# Patient Record
Sex: Male | Born: 1955 | Race: White | Hispanic: No | Marital: Single | Smoking: Never smoker
Health system: Southern US, Community
[De-identification: ages and names within clinical notes are randomized; demographics above are authoritative.]

## PROBLEM LIST (undated history)

## (undated) DIAGNOSIS — I2510 Atherosclerotic heart disease of native coronary artery without angina pectoris: Secondary | ICD-10-CM

## (undated) DIAGNOSIS — E669 Obesity, unspecified: Secondary | ICD-10-CM

## (undated) DIAGNOSIS — E7800 Pure hypercholesterolemia, unspecified: Secondary | ICD-10-CM

## (undated) DIAGNOSIS — I219 Acute myocardial infarction, unspecified: Secondary | ICD-10-CM

## (undated) DIAGNOSIS — E119 Type 2 diabetes mellitus without complications: Secondary | ICD-10-CM

## (undated) DIAGNOSIS — I10 Essential (primary) hypertension: Secondary | ICD-10-CM

## (undated) HISTORY — PX: CORONARY ANGIOPLASTY: SHX604

## (undated) HISTORY — PX: CARDIAC CATHETERIZATION: SHX172

---
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
## 2011-03-05 ENCOUNTER — Inpatient Hospital Stay: Payer: Self-pay | Admitting: Cardiology

## 2011-03-24 ENCOUNTER — Encounter: Payer: Self-pay | Admitting: Cardiology

## 2011-04-02 ENCOUNTER — Encounter: Payer: Self-pay | Admitting: Cardiology

## 2011-05-02 ENCOUNTER — Encounter: Payer: Self-pay | Admitting: Cardiology

## 2012-12-06 ENCOUNTER — Ambulatory Visit: Payer: Self-pay | Admitting: Gastroenterology

## 2018-05-18 ENCOUNTER — Other Ambulatory Visit: Payer: Self-pay | Admitting: Gastroenterology

## 2018-05-18 DIAGNOSIS — D696 Thrombocytopenia, unspecified: Secondary | ICD-10-CM

## 2018-05-18 DIAGNOSIS — K760 Fatty (change of) liver, not elsewhere classified: Secondary | ICD-10-CM

## 2018-05-18 DIAGNOSIS — R17 Unspecified jaundice: Secondary | ICD-10-CM

## 2018-05-26 ENCOUNTER — Ambulatory Visit
Admission: RE | Admit: 2018-05-26 | Discharge: 2018-05-26 | Disposition: A | Discharge status: Home / Self Care | Payer: BC Managed Care – PPO | Source: Ambulatory Visit | Attending: Gastroenterology | Admitting: Gastroenterology

## 2018-05-26 DIAGNOSIS — D696 Thrombocytopenia, unspecified: Secondary | ICD-10-CM

## 2018-05-26 DIAGNOSIS — R161 Splenomegaly, not elsewhere classified: Secondary | ICD-10-CM | POA: Insufficient documentation

## 2018-05-26 DIAGNOSIS — R932 Abnormal findings on diagnostic imaging of liver and biliary tract: Secondary | ICD-10-CM | POA: Diagnosis not present

## 2018-05-26 DIAGNOSIS — R17 Unspecified jaundice: Secondary | ICD-10-CM | POA: Diagnosis present

## 2018-05-26 DIAGNOSIS — K760 Fatty (change of) liver, not elsewhere classified: Secondary | ICD-10-CM

## 2018-07-21 ENCOUNTER — Encounter: Payer: Self-pay | Admitting: *Deleted

## 2018-07-26 ENCOUNTER — Encounter: Payer: Self-pay | Admitting: *Deleted

## 2018-07-27 ENCOUNTER — Ambulatory Visit: Payer: BC Managed Care – PPO | Admitting: Anesthesiology

## 2018-07-27 ENCOUNTER — Encounter
Admission: RE | Disposition: A | Discharge status: Home / Self Care | Payer: Self-pay | Source: Ambulatory Visit | Attending: Gastroenterology

## 2018-07-27 ENCOUNTER — Ambulatory Visit
Admission: RE | Admit: 2018-07-27 | Discharge: 2018-07-27 | Disposition: A | Discharge status: Home / Self Care | Payer: BC Managed Care – PPO | Source: Ambulatory Visit | Attending: Gastroenterology | Admitting: Gastroenterology

## 2018-07-27 DIAGNOSIS — Z7982 Long term (current) use of aspirin: Secondary | ICD-10-CM | POA: Insufficient documentation

## 2018-07-27 DIAGNOSIS — I2510 Atherosclerotic heart disease of native coronary artery without angina pectoris: Secondary | ICD-10-CM | POA: Diagnosis not present

## 2018-07-27 DIAGNOSIS — E7800 Pure hypercholesterolemia, unspecified: Secondary | ICD-10-CM | POA: Diagnosis not present

## 2018-07-27 DIAGNOSIS — Z6838 Body mass index (BMI) 38.0-38.9, adult: Secondary | ICD-10-CM | POA: Insufficient documentation

## 2018-07-27 DIAGNOSIS — I252 Old myocardial infarction: Secondary | ICD-10-CM | POA: Diagnosis not present

## 2018-07-27 DIAGNOSIS — I110 Hypertensive heart disease with heart failure: Secondary | ICD-10-CM | POA: Insufficient documentation

## 2018-07-27 DIAGNOSIS — E119 Type 2 diabetes mellitus without complications: Secondary | ICD-10-CM | POA: Insufficient documentation

## 2018-07-27 DIAGNOSIS — Z1211 Encounter for screening for malignant neoplasm of colon: Secondary | ICD-10-CM | POA: Insufficient documentation

## 2018-07-27 DIAGNOSIS — E669 Obesity, unspecified: Secondary | ICD-10-CM | POA: Diagnosis not present

## 2018-07-27 DIAGNOSIS — Z79899 Other long term (current) drug therapy: Secondary | ICD-10-CM | POA: Diagnosis not present

## 2018-07-27 DIAGNOSIS — I509 Heart failure, unspecified: Secondary | ICD-10-CM | POA: Insufficient documentation

## 2018-07-27 DIAGNOSIS — Z7984 Long term (current) use of oral hypoglycemic drugs: Secondary | ICD-10-CM | POA: Diagnosis not present

## 2018-07-27 DIAGNOSIS — K5730 Diverticulosis of large intestine without perforation or abscess without bleeding: Secondary | ICD-10-CM | POA: Diagnosis not present

## 2018-07-27 HISTORY — DX: Acute myocardial infarction, unspecified: I21.9

## 2018-07-27 HISTORY — DX: Pure hypercholesterolemia, unspecified: E78.00

## 2018-07-27 HISTORY — PX: COLONOSCOPY WITH PROPOFOL: SHX5780

## 2018-07-27 HISTORY — DX: Essential (primary) hypertension: I10

## 2018-07-27 HISTORY — DX: Type 2 diabetes mellitus without complications: E11.9

## 2018-07-27 HISTORY — DX: Atherosclerotic heart disease of native coronary artery without angina pectoris: I25.10

## 2018-07-27 HISTORY — DX: Obesity, unspecified: E66.9

## 2018-07-27 LAB — PROTIME-INR
INR: 1.06
Prothrombin Time: 13.7 seconds (ref 11.4–15.2)

## 2018-07-27 LAB — GLUCOSE, CAPILLARY: Glucose-Capillary: 149 mg/dL — ABNORMAL HIGH (ref 70–99)

## 2018-07-27 SURGERY — COLONOSCOPY WITH PROPOFOL
Anesthesia: General

## 2018-07-27 MED ORDER — METOPROLOL TARTRATE 5 MG/5ML IV SOLN
INTRAVENOUS | Status: DC | PRN
  Administered 2018-07-27: 5 mg via INTRAVENOUS

## 2018-07-27 MED ORDER — PROPOFOL 500 MG/50ML IV EMUL
INTRAVENOUS | Status: DC | PRN
  Administered 2018-07-27: 125 ug/kg/min via INTRAVENOUS

## 2018-07-27 MED ORDER — PROPOFOL 10 MG/ML IV BOLUS
INTRAVENOUS | Status: DC | PRN
  Administered 2018-07-27: 40 mg via INTRAVENOUS
  Administered 2018-07-27: 70 mg via INTRAVENOUS
  Administered 2018-07-27: 20 mg via INTRAVENOUS
  Administered 2018-07-27: 40 mg via INTRAVENOUS
  Administered 2018-07-27: 30 mg via INTRAVENOUS
  Administered 2018-07-27 (×3): 20 mg via INTRAVENOUS
  Administered 2018-07-27: 30 mg via INTRAVENOUS
  Administered 2018-07-27: 40 mg via INTRAVENOUS
  Administered 2018-07-27: 10 mg via INTRAVENOUS
  Administered 2018-07-27 (×2): 30 mg via INTRAVENOUS
  Administered 2018-07-27: 40 mg via INTRAVENOUS

## 2018-07-27 MED ORDER — SODIUM CHLORIDE 0.9 % IV SOLN
INTRAVENOUS | Status: DC
  Administered 2018-07-27: 11:00:00 via INTRAVENOUS

## 2018-07-27 MED ORDER — LIDOCAINE HCL (PF) 2 % IJ SOLN
INTRAMUSCULAR | Status: DC | PRN
  Administered 2018-07-27: 100 mg via INTRADERMAL

## 2018-07-27 NOTE — Op Note (Signed)
[facility] Patient Name: James Navarro Procedure Date: 07/27/2018 11:11 AM MRN: [id number] Account #: [id number] Date of Birth: 10-25-1956 Admit Type: Outpatient Age: 62 Room: [facility] ENDO ROOM 1 Gender: Male Note Status: Finalized Procedure:            Colonoscopy Indications:          Screening for colorectal malignant neoplasm Providers:            Christena Deem, MD Referring MD:         Marisue Ivan (Referring MD) Medicines:            Monitored Anesthesia Care Complications:        No immediate complications. Procedure:            Pre-Anesthesia Assessment:                       - ASA Grade Assessment: III - A patient with severe                        systemic disease.                       After obtaining informed consent, the colonoscope was                        passed under direct vision. Throughout the procedure,                        the patient's blood pressure, pulse, and oxygen                        saturations were monitored continuously. The                        Colonoscope was introduced through the anus and                        advanced to the the cecum, identified by appendiceal                        orifice and ileocecal valve. The colonoscopy was                        performed with moderate difficulty due to a redundant                        colon and significant looping. Successful completion of                        the procedure was aided by changing the patient to a                        supine position and using manual pressure. The patient                        tolerated the procedure well. The quality of the bowel                        preparation was good. Findings:      A 2 mm polyp was found in the proximal ascending  colon. The polyp was       sessile. The polyp was removed with a cold biopsy forceps. Resection and       retrieval were complete.      Multiple medium-mouthed diverticula  were found in the sigmoid colon and       descending colon.      A single large-mouthed diverticulum was found at 30 cm proximal to the       anus. This was apparently inverted, normal appearing mucosa on NBI and       soft to touch on contact with a closed forcep.      Prominant rectal vasculature.      The retroflexed view of the distal rectum and anal verge was normal and       showed no anal or rectal abnormalities otherwise.      The digital rectal exam was normal. Impression:           - One 2 mm polyp in the proximal ascending colon,                        removed with a cold biopsy forceps. Resected and                        retrieved.                       - Diverticulosis in the sigmoid colon and in the                        descending colon.                       - Diverticulosis at 30 cm proximal to the anus. This                        was apparently inverted, normal appearing mucosa on NBI                        and soft to touch o.                       - The distal rectum and anal verge are normal on                        retroflexion view. Recommendation:       - Discharge patient to home.                       - Await pathology results.                       - Telephone GI clinic for pathology results in 1 week. Procedure Code(s):    --- Professional ---                       [phone], Colonoscopy, flexible; with biopsy, single or                        multiple Diagnosis Code(s):    --- Professional ---                       Z12.11, Encounter for screening for  malignant neoplasm                        of colon                       D12.2, Benign neoplasm of ascending colon                       K57.30, Diverticulosis of large intestine without                        perforation or abscess without bleeding CPT copyright 2017 American Medical Association. All rights reserved. The codes documented in this report are preliminary and upon coder review may  be revised to  meet current compliance requirements. Christena Deem, MD 07/27/2018 12:03:26 PM This report has been signed electronically. Number of Addenda: 0 Note Initiated On: 07/27/2018 11:11 AM Scope Withdrawal Time: 0 hours 14 minutes 10 seconds  Total Procedure Duration: 0 hours 32 minutes 50 seconds       [facility]

## 2018-07-27 NOTE — H&P (Signed)
Outpatient short stay form Pre-procedure 07/27/2018 11:02 AM Lollie Sails MD  Primary Physician: Dr Dion Body  Reason for visit: Colonoscopy  History of present illness: Patient is a 62 year old male presenting today as above.  This is first colonoscopy.  He tolerated his prep well.  He does take Effient that agent has been held for about 7 days.  He takes also a baby aspirin that was last taken yesterday.  He takes no other aspirin products or blood thinning agent.    Current Facility-Administered Medications:  .  0.9 %  sodium chloride infusion, , Intravenous, Continuous, Lollie Sails, MD  Medications Prior to Admission  Medication Sig Dispense Refill Last Dose  . aspirin EC 81 MG tablet Take 81 mg by mouth daily.   07/26/2018 at Unknown time  . blood glucose meter kit and supplies by Other route as directed. Dispense based on patient and insurance preference. Use up to four times daily as directed. (FOR ICD-10 E10.9, E11.9).     . isosorbide mononitrate (IMDUR) 30 MG 24 hr tablet Take 30 mg by mouth daily.   07/26/2018 at Unknown time  . lisinopril (PRINIVIL,ZESTRIL) 5 MG tablet Take 5 mg by mouth daily.   07/26/2018 at Unknown time  . metFORMIN (GLUCOPHAGE) 1000 MG tablet Take 1,000 mg by mouth 2 (two) times daily with a meal.   Past Week at Unknown time  . metoprolol succinate (TOPROL-XL) 50 MG 24 hr tablet Take 50 mg by mouth daily. Take with or immediately following a meal.   07/26/2018 at Unknown time  . nitroGLYCERIN (NITROSTAT) 0.4 MG SL tablet Place 0.4 mg under the tongue every 5 (five) minutes as needed for chest pain.     . prasugrel (EFFIENT) 10 MG TABS tablet Take 10 mg by mouth daily.   07/19/2018 at Unknown time  . simvastatin (ZOCOR) 20 MG tablet Take 20 mg by mouth daily.        No Known Allergies   Past Medical History:  Diagnosis Date  . Coronary artery disease   . Diabetes mellitus without complication ([place])   . Hypercholesterolemia   .  Hypertension   . Myocardial infarction ([place])   . Obesity     Review of systems:      Physical Exam    Heart and lungs: Rhythm without rub or gallop, lungs are bilaterally.    HEENT: Cephalic atraumatic eyes are anicteric    Other:    Pertinant exam for procedure: Soft nontender nondistended bowel sounds positive normoactive.    Planned proceedures: Colonoscopy and indicated procedures. I have discussed the risks benefits and complications of procedures to include not limited to bleeding, infection, perforation and the risk of sedation and the patient wishes to proceed.    Lollie Sails, MD Gastroenterology 07/27/2018  11:02 AM

## 2018-07-27 NOTE — Anesthesia Preprocedure Evaluation (Addendum)
Anesthesia Evaluation  Patient identified by MRN, date of birth, ID band Patient awake    Reviewed: Allergy & Precautions, H&P , NPO status , Patient's Chart, lab work & pertinent test results  Airway Mallampati: III  TM Distance: >3 FB Neck ROM: full   Comment: Thick neck Dental  (+) Missing   Pulmonary neg pulmonary ROS,    breath sounds clear to auscultation       Cardiovascular hypertension, + CAD, + Past MI and +CHF  negative cardio ROS   Rhythm:regular Rate:Normal  Echo 06/14/18: MILD SEGMENTAL LV SYSTOLIC DYSFUNCTION WITH AN ESTIMATED EF = 40 % NORMAL RIGHT VENTRICULAR SYSTOLIC FUNCTION TRACE TRICUSPID AND MITRAL VALVE INSUFFICIENCY NO VALVULAR STENOSIS MILD LV ENLARGEMENT MILD RV ENLARGEMENT MILD BIATRIAL ENLARGEMENT BASAL-TO-APICAL ANTEROSEPTAL AND INFEROSEPTAL WALL HYPOKINESIS   Neuro/Psych negative neurological ROS  negative psych ROS   GI/Hepatic negative GI ROS, Neg liver ROS,   Endo/Other  negative endocrine ROSdiabetes  Renal/GU negative Renal ROS  negative genitourinary   Musculoskeletal   Abdominal   Peds  Hematology negative hematology ROS (+)   Anesthesia Other Findings Past Medical History: No date: Coronary artery disease No date: Diabetes mellitus without complication (HCC) No date: Hypercholesterolemia No date: Hypertension No date: Myocardial infarction (HCC) No date: Obesity  Past Surgical History: No date: CARDIAC CATHETERIZATION No date: CORONARY ANGIOPLASTY 1961: eye stye lanced  BMI    Body Mass Index:  38.35 kg/m      Reproductive/Obstetrics negative OB ROS                            Anesthesia Physical Anesthesia Plan  ASA: III  Anesthesia Plan: General   Post-op Pain Management:    Induction:   PONV Risk Score and Plan: Propofol infusion and TIVA  Airway Management Planned: Natural Airway and Nasal Cannula  Additional Equipment:    Intra-op Plan:   Post-operative Plan:   Informed Consent: I have reviewed the patients History and Physical, chart, labs and discussed the procedure including the risks, benefits and alternatives for the proposed anesthesia with the patient or authorized representative who has indicated his/her understanding and acceptance.   Dental Advisory Given  Plan Discussed with: Anesthesiologist, CRNA and Surgeon  Anesthesia Plan Comments:         Anesthesia Quick Evaluation

## 2018-07-27 NOTE — Anesthesia Post-op Follow-up Note (Signed)
Anesthesia QCDR form completed.        

## 2018-07-27 NOTE — Transfer of Care (Signed)
Immediate Anesthesia Transfer of Care Note  Patient: James Navarro  Procedure(s) Performed: COLONOSCOPY WITH PROPOFOL (N/A )  Patient Location: PACU  Anesthesia Type:General  Level of Consciousness: sedated  Airway & Oxygen Therapy: Patient Spontanous Breathing and Patient connected to nasal cannula oxygen  Post-op Assessment: Report given to RN and Post -op Vital signs reviewed and stable  Post vital signs: Reviewed and stable  Last Vitals:  Vitals Value Taken Time  BP 96/65 07/27/2018 12:04 PM  Temp    Pulse 65 07/27/2018 12:08 PM  Resp 17 07/27/2018 12:08 PM  SpO2 97 % 07/27/2018 12:08 PM  Vitals shown include unvalidated device data.  Last Pain:  Vitals:   07/27/18 1204  TempSrc: Tympanic  PainSc: Asleep         Complications: No apparent anesthesia complications

## 2018-07-28 LAB — SURGICAL PATHOLOGY

## 2018-07-31 ENCOUNTER — Encounter: Payer: Self-pay | Admitting: Gastroenterology

## 2018-08-01 NOTE — Anesthesia Postprocedure Evaluation (Signed)
Anesthesia Post Note  Patient: James Navarro  Procedure(s) Performed: COLONOSCOPY WITH PROPOFOL (N/A )  Patient location during evaluation: PACU Anesthesia Type: General Level of consciousness: awake and alert Pain management: pain level controlled Vital Signs Assessment: post-procedure vital signs reviewed and stable Respiratory status: spontaneous breathing, nonlabored ventilation and respiratory function stable Cardiovascular status: blood pressure returned to baseline and stable Postop Assessment: no apparent nausea or vomiting Anesthetic complications: no     Last Vitals:  Vitals:   07/27/18 1244 07/27/18 1254  BP: 133/78 125/84  Pulse: (!) 56 (!) 54  Resp: 15 14  Temp:    SpO2: 100% 97%    Last Pain:  Vitals:   07/27/18 1244  TempSrc:   PainSc: 0-No pain                 [name]

## 2019-12-13 ENCOUNTER — Ambulatory Visit: Payer: BC Managed Care – PPO

## 2020-11-01 ENCOUNTER — Encounter: Payer: Self-pay | Admitting: Emergency Medicine

## 2020-11-01 ENCOUNTER — Other Ambulatory Visit: Payer: Self-pay

## 2020-11-01 ENCOUNTER — Emergency Department: Payer: BC Managed Care – PPO

## 2020-11-01 ENCOUNTER — Emergency Department
Admission: EM | Admit: 2020-11-01 | Discharge: 2020-11-01 | Disposition: A | Discharge status: Home / Self Care | Payer: BC Managed Care – PPO | Attending: Emergency Medicine | Admitting: Emergency Medicine

## 2020-11-01 DIAGNOSIS — Z7984 Long term (current) use of oral hypoglycemic drugs: Secondary | ICD-10-CM | POA: Diagnosis not present

## 2020-11-01 DIAGNOSIS — E1169 Type 2 diabetes mellitus with other specified complication: Secondary | ICD-10-CM | POA: Insufficient documentation

## 2020-11-01 DIAGNOSIS — E7800 Pure hypercholesterolemia, unspecified: Secondary | ICD-10-CM | POA: Diagnosis not present

## 2020-11-01 DIAGNOSIS — Z79899 Other long term (current) drug therapy: Secondary | ICD-10-CM | POA: Insufficient documentation

## 2020-11-01 DIAGNOSIS — Z7982 Long term (current) use of aspirin: Secondary | ICD-10-CM | POA: Diagnosis not present

## 2020-11-01 DIAGNOSIS — Z955 Presence of coronary angioplasty implant and graft: Secondary | ICD-10-CM | POA: Insufficient documentation

## 2020-11-01 DIAGNOSIS — J069 Acute upper respiratory infection, unspecified: Secondary | ICD-10-CM | POA: Diagnosis not present

## 2020-11-01 DIAGNOSIS — Z20822 Contact with and (suspected) exposure to covid-19: Secondary | ICD-10-CM | POA: Diagnosis not present

## 2020-11-01 DIAGNOSIS — I2510 Atherosclerotic heart disease of native coronary artery without angina pectoris: Secondary | ICD-10-CM | POA: Diagnosis not present

## 2020-11-01 DIAGNOSIS — I10 Essential (primary) hypertension: Secondary | ICD-10-CM | POA: Insufficient documentation

## 2020-11-01 DIAGNOSIS — Z87891 Personal history of nicotine dependence: Secondary | ICD-10-CM | POA: Diagnosis not present

## 2020-11-01 DIAGNOSIS — J40 Bronchitis, not specified as acute or chronic: Secondary | ICD-10-CM | POA: Insufficient documentation

## 2020-11-01 DIAGNOSIS — R0981 Nasal congestion: Secondary | ICD-10-CM | POA: Diagnosis present

## 2020-11-01 LAB — BASIC METABOLIC PANEL
Anion gap: 10 (ref 5–15)
BUN: 16 mg/dL (ref 8–23)
CO2: 25 mmol/L (ref 22–32)
Calcium: 9.3 mg/dL (ref 8.9–10.3)
Chloride: 100 mmol/L (ref 98–111)
Creatinine, Ser: 0.93 mg/dL (ref 0.61–1.24)
GFR, Estimated: >60 mL/min (ref >60)
Glucose, Bld: 170 mg/dL — ABNORMAL HIGH (ref 70–99)
Potassium: 4 mmol/L (ref 3.5–5.1)
Sodium: 135 mmol/L (ref 135–145)

## 2020-11-01 LAB — CBC
HCT: 39.6 % (ref 39.0–52.0)
Hemoglobin: 14 g/dL (ref 13.0–17.0)
MCH: 30.8 pg (ref 26.0–34.0)
MCHC: 35.4 g/dL (ref 30.0–36.0)
MCV: 87.2 fL (ref 80.0–100.0)
Platelets: 126 10*3/uL — ABNORMAL LOW (ref 150–400)
RBC: 4.54 MIL/uL (ref 4.22–5.81)
RDW: 13 % (ref 11.5–15.5)
WBC: 9.4 10*3/uL (ref 4.0–10.5)
nRBC: 0 % (ref 0.0–0.2)

## 2020-11-01 LAB — SARS CORONAVIRUS 2 (TAT 6-24 HRS): SARS Coronavirus 2: NEGATIVE

## 2020-11-01 LAB — TROPONIN I (HIGH SENSITIVITY)
Troponin I (High Sensitivity): 7 ng/L (ref <18)
Troponin I (High Sensitivity): 6 ng/L (ref <18)

## 2020-11-01 MED ORDER — AZITHROMYCIN 250 MG PO TABS: ORAL_TABLET | ORAL | 0 refills | Status: AC

## 2020-11-01 MED ORDER — ALBUTEROL SULFATE HFA 108 (90 BASE) MCG/ACT IN AERS: 2.0000 | INHALATION_SPRAY | Freq: Four times a day (QID) | RESPIRATORY_TRACT | 2 refills | Status: DC | PRN

## 2020-11-01 NOTE — ED Notes (Signed)
Pt alert and oriented X 4, stable for discharge. RR even and unlabored, color WNL. Discussed discharge instructions and follow-up as directed. Discharge medications discussed if prescribed. Pt had opportunity to ask questions, and RN to provide patient/family eduction.  

## 2020-11-01 NOTE — ED Triage Notes (Signed)
Patient states that he has been congested. Patient states that he took an at home COVID test on Tuesday that was negative. Patient states that he has intermittent episodes of gasping for air. Patient states that the gasping improves when he talks to someone.

## 2020-11-01 NOTE — ED Provider Notes (Signed)
[facility] Emergency Department Provider Note  Time seen: 10:59 AM  I have reviewed the triage vital signs and the nursing notes.   HISTORY  Chief Complaint Nasal Congestion and Shortness of Breath   HPI James Navarro is a 65 y.o. male with a past medical history of CAD, diabetes, hypertension, hyperlipidemia, prior MI, presents to the emergency department for cough and shortness of breath.  According to the patient over the past week or so he has been coughing and feeling a little short of breath.  He has also noticed a mild wheeze at times which he states he has had previously but is never been diagnosed with asthma.  Denies any known fever.  Largely negative review of systems otherwise.  Overall patient appears well, no distress, reassuring vitals.   Past Medical History:  Diagnosis Date  . Coronary artery disease   . Diabetes mellitus without complication ([place])   . Hypercholesterolemia   . Hypertension   . Myocardial infarction ([place])   . Obesity     There are no problems to display for this patient.   Past Surgical History:  Procedure Laterality Date  . CARDIAC CATHETERIZATION    . COLONOSCOPY WITH PROPOFOL N/A 07/27/2018   Procedure: COLONOSCOPY WITH PROPOFOL;  Surgeon: Lollie Sails, MD;  Location: [facility] ENDOSCOPY;  Service: Endoscopy;  Laterality: N/A;  . CORONARY ANGIOPLASTY    . eye stye lanced  1961    Prior to Admission medications   Medication Sig Start Date End Date Taking? Authorizing Provider  albuterol (VENTOLIN HFA) 108 (90 Base) MCG/ACT inhaler Inhale 2 puffs into the lungs every 6 (six) hours as needed for wheezing or shortness of breath. 11/01/20  Yes Harvest Dark, MD  azithromycin (ZITHROMAX Z-PAK) 250 MG tablet Take 2 tablets (500 mg) on  Day 1,  followed by 1 tablet (250 mg) once daily on Days 2 through 5. 11/01/20 11/06/20 Yes Harvest Dark, MD  aspirin EC 81 MG tablet Take 81 mg by mouth daily.    [provider]  blood glucose meter kit and supplies by Other route as directed. Dispense based on patient and insurance preference. Use up to four times daily as directed. (FOR ICD-10 E10.9, E11.9).    [provider]  isosorbide mononitrate (IMDUR) 30 MG 24 hr tablet Take 30 mg by mouth daily.    [provider]  lisinopril (PRINIVIL,ZESTRIL) 5 MG tablet Take 5 mg by mouth daily.    [provider]  metFORMIN (GLUCOPHAGE) 1000 MG tablet Take 1,000 mg by mouth 2 (two) times daily with a meal.    [provider]  metoprolol succinate (TOPROL-XL) 50 MG 24 hr tablet Take 50 mg by mouth daily. Take with or immediately following a meal.    [provider]  nitroGLYCERIN (NITROSTAT) 0.4 MG SL tablet Place 0.4 mg under the tongue every 5 (five) minutes as needed for chest pain.    [provider]  prasugrel (EFFIENT) 10 MG TABS tablet Take 10 mg by mouth daily.    [provider]  simvastatin (ZOCOR) 20 MG tablet Take 20 mg by mouth daily.    [provider]    No Known Allergies  No family history on file.  Social History Social History   Tobacco Use  . Smoking status: Never Smoker  . Smokeless tobacco: Former Systems developer    Types: Secondary school teacher  . Vaping Use: Never used  Substance Use Topics  . Alcohol  use: Never  . Drug use: Never    Review of Systems Constitutional: Negative for fever. ENT: Negative for recent illness/congestion Cardiovascular: Negative for chest pain. Respiratory: Mild shortness of breath, intermittent.  Occasional cough. Gastrointestinal: Negative for abdominal pain, vomiting Musculoskeletal: Negative for musculoskeletal complaints Neurological: Negative for headache All other ROS negative  ____________________________________________   PHYSICAL EXAM:  VITAL SIGNS: ED Triage Vitals [11/01/20 0515]  Enc Vitals Group     BP 140/75     Pulse Rate 77     Resp 18     Temp 97.9 F (36.6  C)     Temp Source Oral     SpO2 100 %     Weight 260 lb (117.9 kg)     Height _0  (1.778 m)     Head Circumference      Peak Flow      Pain Score 0     Pain Loc      Pain Edu?      Excl. in [place]?    Constitutional: Alert and oriented. Well appearing and in no distress. Eyes: Normal exam ENT      Head: Normocephalic and atraumatic.      Mouth/Throat: Mucous membranes are moist. Cardiovascular: Normal rate, regular rhythm.  Respiratory: Normal respiratory effort without tachypnea nor retractions.  Very slight end expiratory wheeze. Gastrointestinal: Soft and nontender. No distention.   Musculoskeletal: Nontender with normal range of motion in all extremities.  Neurologic:  Normal speech and language. No gross focal neurologic deficits  Skin:  Skin is warm, dry and intact.  Psychiatric: Mood and affect are normal.   ____________________________________________    EKG  EKG viewed and interpreted by myself shows normal sinus rhythm at 72 bpm with a narrow QRS, normal axis, normal intervals, no concerning ST changes.  ____________________________________________    RADIOLOGY  Chest x-ray shows mild streaky perihilar opacities.  ____________________________________________   INITIAL IMPRESSION / ASSESSMENT AND PLAN / ED COURSE  Pertinent labs & imaging results that were available during my care of the patient were reviewed by me and considered in my medical decision making (see chart for details).   Patient presents emergency department for 1 week of cough shortness of breath and intermittent wheeze.  Patient's work-up does show mild perihilar streaky opacities.  Given the patient's cough we will check a PCR Covid swab.  Patient did take a rapid home test several days ago that was negative.  We will treat with Zithromax and an albuterol inhaler.  Provided my typical return precautions.  Patient agreeable to plan of care.  DONTAYE HUR was evaluated in Emergency  Department on 11/01/2020 for the symptoms described in the history of present illness. He was evaluated in the context of the global COVID-19 pandemic, which necessitated consideration that the patient might be at risk for infection with the SARS-CoV-2 virus that causes COVID-19. Institutional protocols and algorithms that pertain to the evaluation of patients at risk for COVID-19 are in a state of rapid change based on information released by regulatory bodies including the CDC and federal and state organizations. These policies and algorithms were followed during the patient's care in the ED.  ____________________________________________   FINAL CLINICAL IMPRESSION(S) / ED DIAGNOSES  Bronchitis URI   Harvest Dark, MD 11/01/20 1102

## 2020-11-01 NOTE — ED Notes (Signed)
EDP at bedside  

## 2021-04-26 IMAGING — CR DG CHEST 2V
1 series · 2 of 2 positions shown · non-contrast
Comparison: None.

CLINICAL DATA: Dyspnea

EXAM:
CHEST - 2 VIEW

[Series 1: dg chest 2 view · 0.14mm/px · 2 of 2 slices shown]
[im 1/2]
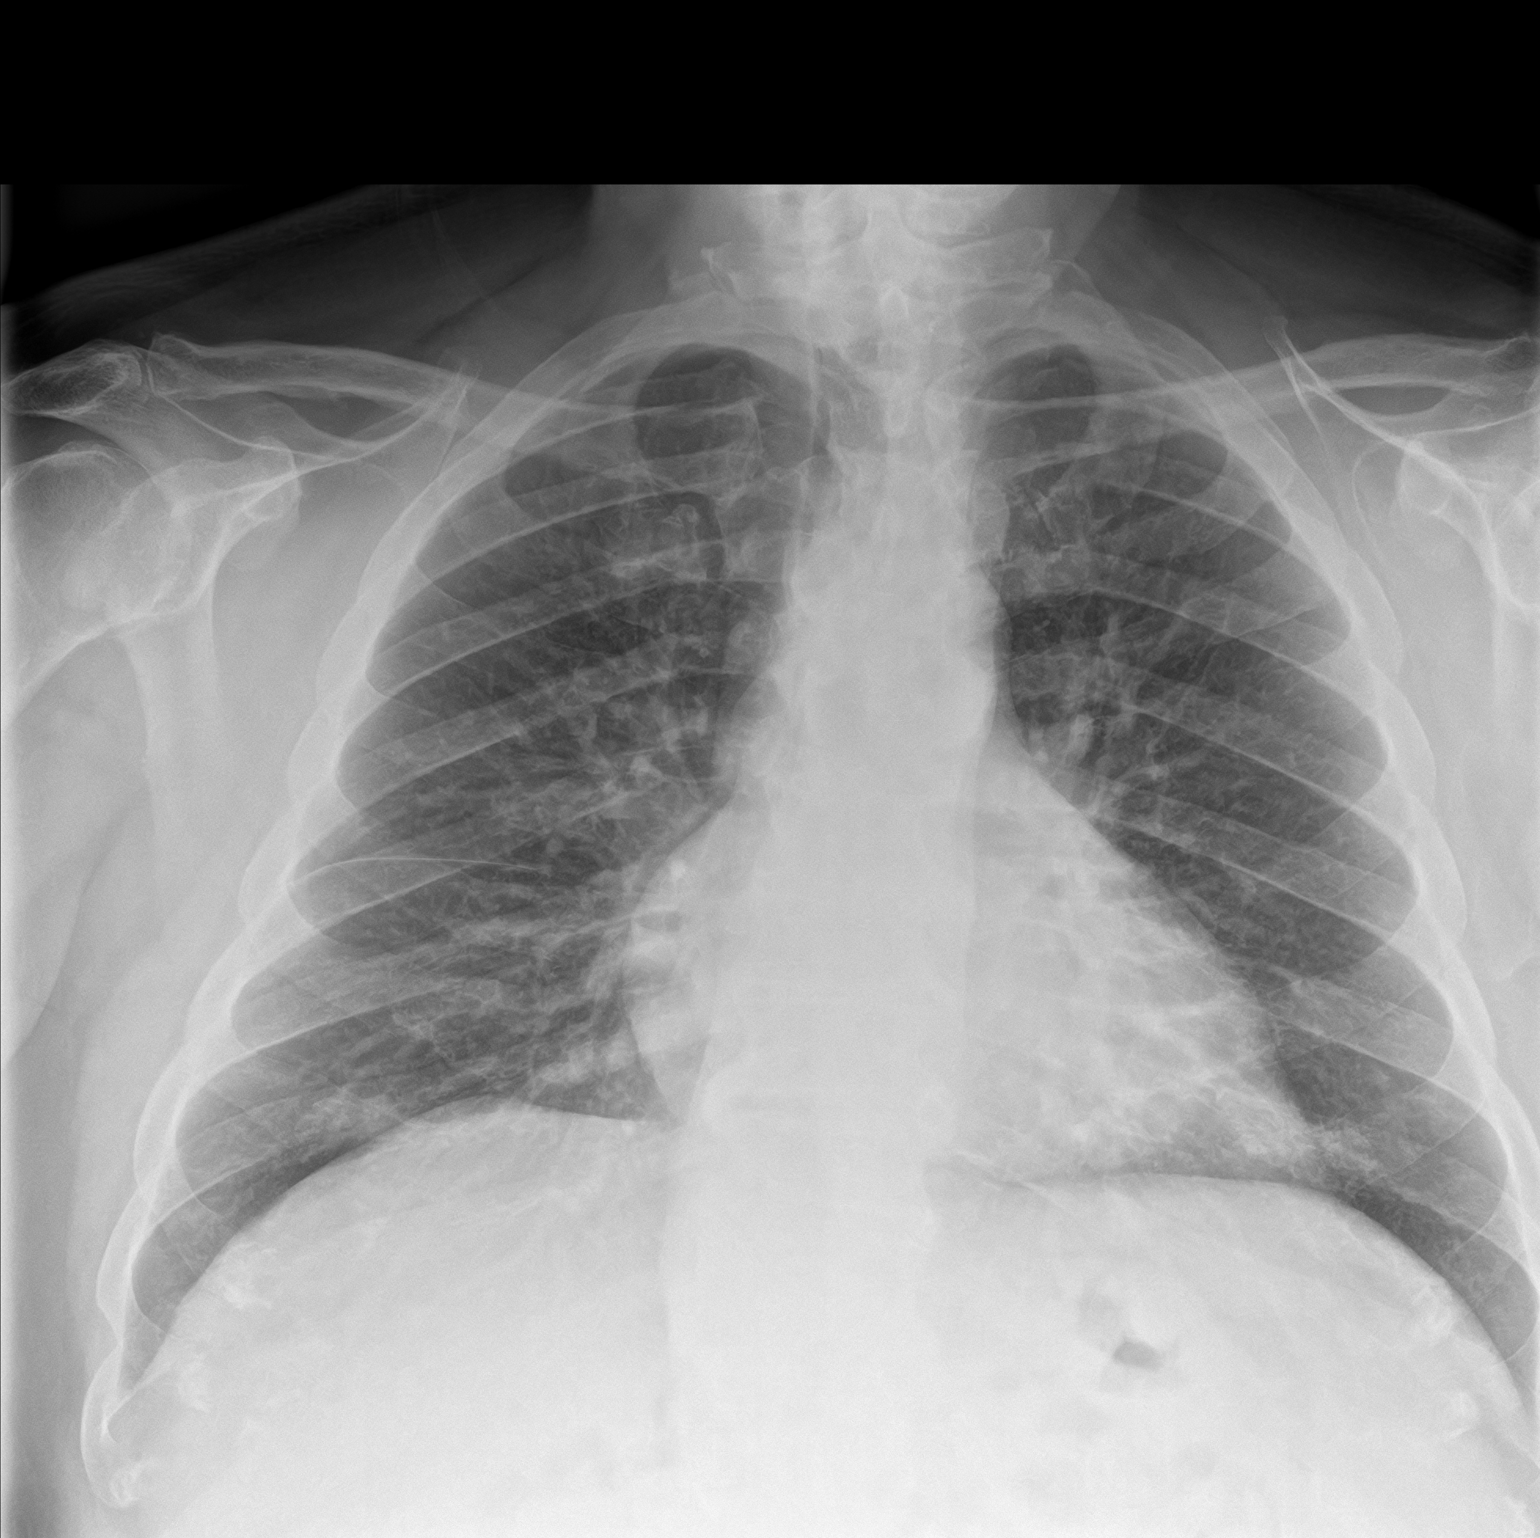
[im 2/2]
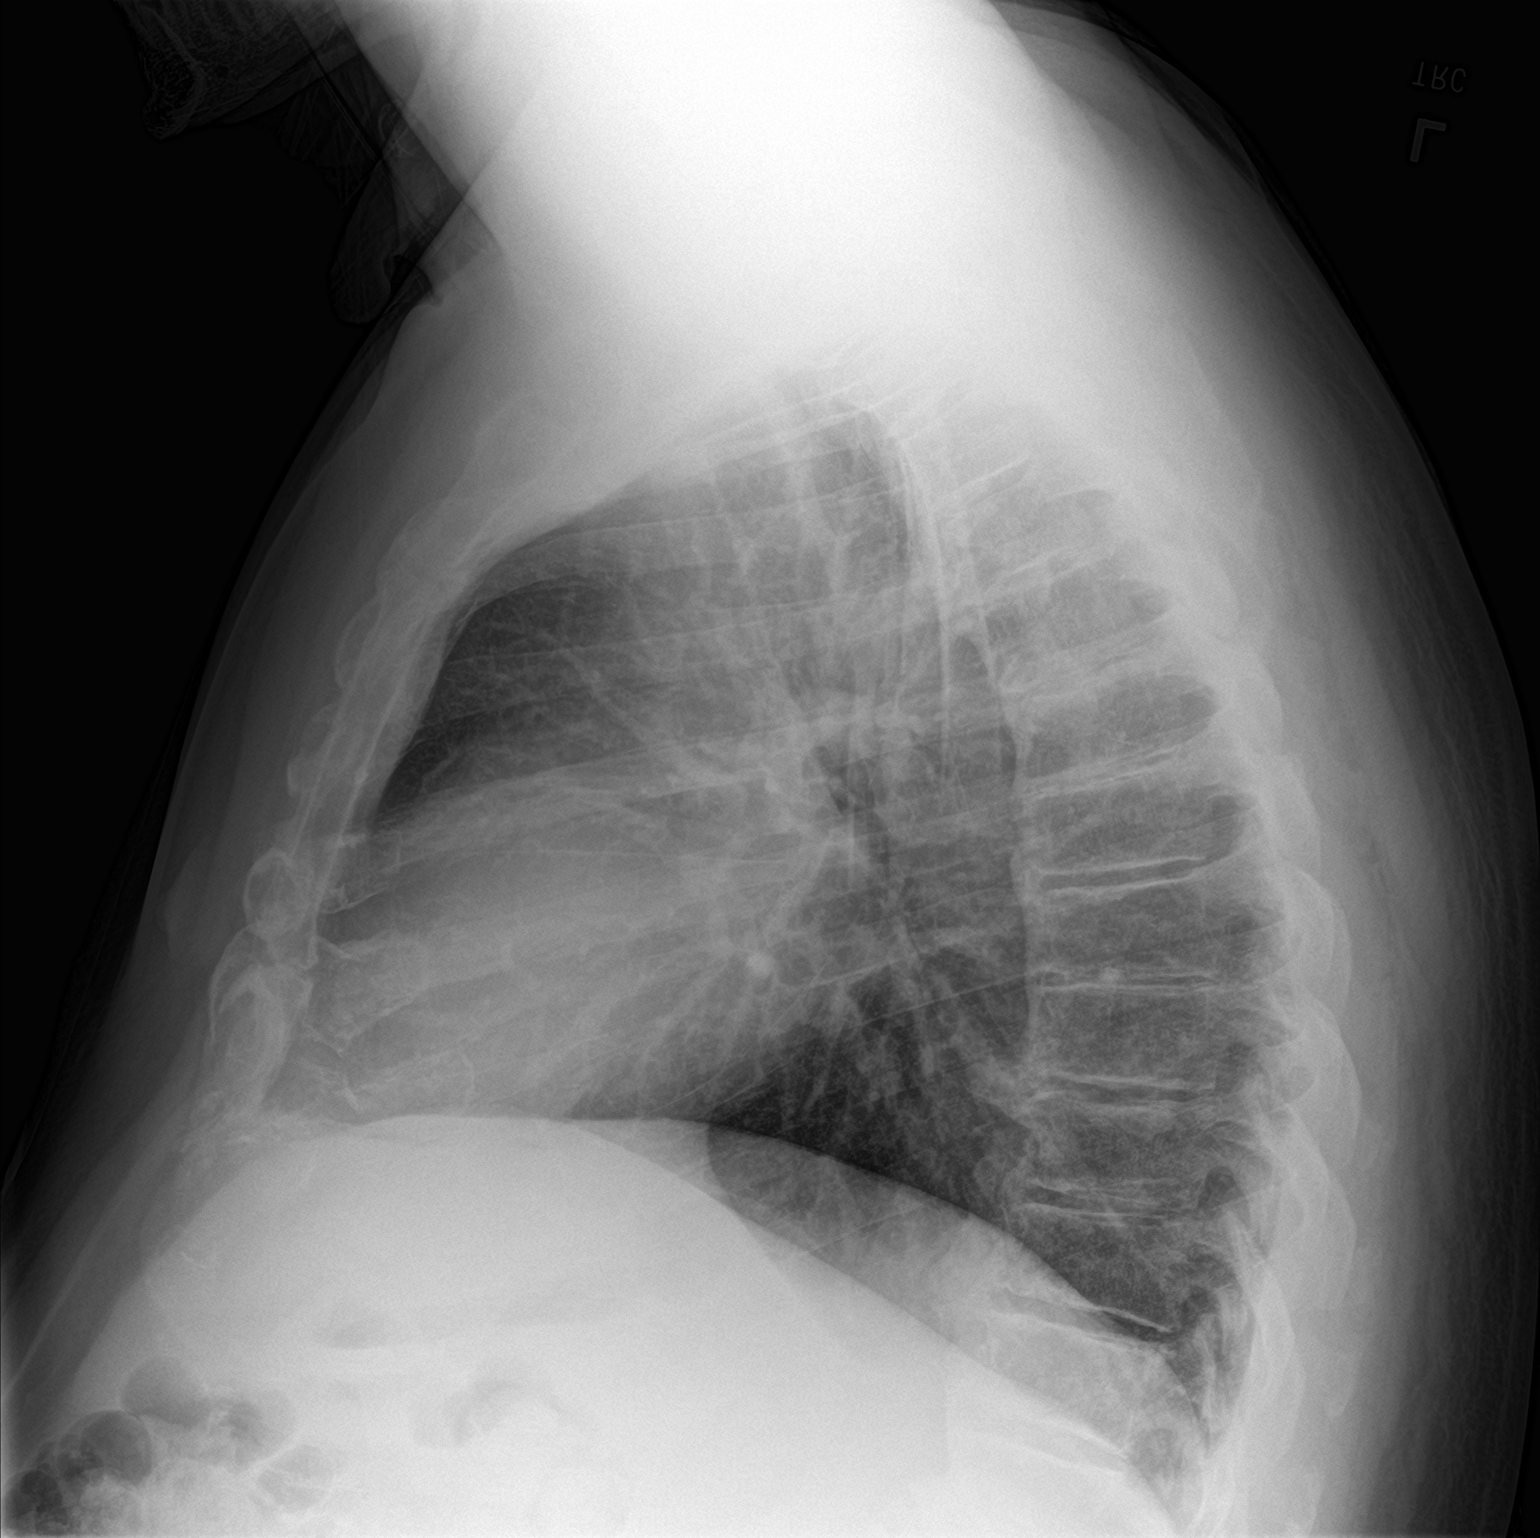

[2 of 2 positions shown; findings below may reference images not displayed]

FINDINGS: Normal heart size. Normal mediastinal contour. No pneumothorax. No
pleural effusion. Mild streaky parahilar interstitial opacities.
IMPRESSION: Mild streaky parahilar interstitial opacities, cannot exclude
atypical infection.

## 2021-10-27 ENCOUNTER — Other Ambulatory Visit: Payer: Self-pay | Admitting: Gastroenterology

## 2021-10-27 DIAGNOSIS — K760 Fatty (change of) liver, not elsewhere classified: Secondary | ICD-10-CM

## 2023-05-07 ENCOUNTER — Encounter: Payer: Self-pay | Admitting: Emergency Medicine

## 2023-05-07 ENCOUNTER — Ambulatory Visit
Admission: EM | Admit: 2023-05-07 | Discharge: 2023-05-07 | Disposition: A | Discharge status: Home / Self Care | Payer: BC Managed Care – PPO

## 2023-05-07 DIAGNOSIS — K1121 Acute sialoadenitis: Secondary | ICD-10-CM

## 2023-05-07 MED ORDER — AMOXICILLIN-POT CLAVULANATE 875-125 MG PO TABS
1.0000 | ORAL_TABLET | Freq: Two times a day (BID) | ORAL | 0 refills | Status: DC
Start: 2023-05-07 — End: 2023-08-16

## 2023-05-07 MED ORDER — PREDNISONE 20 MG PO TABS
ORAL_TABLET | ORAL | 0 refills | Status: AC
Start: 2023-05-07 — End: 2023-05-12

## 2023-05-07 NOTE — Discharge Instructions (Signed)
Follow up here or with your primary care provider if your symptoms are worsening or not improving with treatment.     

## 2023-05-07 NOTE — ED Provider Notes (Signed)
[facility]    CSN: [id number] Arrival date & time: 05/07/23  1328      History   Chief Complaint Chief Complaint  Patient presents with   Facial Swelling    HPI ZEDRICK WHISLER is a 67 y.o. male.   HPI  Resents to UC with complaint of left-sided jaw swelling x 2 days.  He endorses tenderness and soreness preventing him from opening his mouth fully.  PMH includes dental work a few months ago but denies any issues.  Past Medical History:  Diagnosis Date   Coronary artery disease    Diabetes mellitus without complication (HCC)    Hypercholesterolemia    Hypertension    Myocardial infarction (HCC)    Obesity     There are no problems to display for this patient.   Past Surgical History:  Procedure Laterality Date   CARDIAC CATHETERIZATION     COLONOSCOPY WITH PROPOFOL N/A 07/27/2018   Procedure: COLONOSCOPY WITH PROPOFOL;  Surgeon: Christena Deem, MD;  Location: [facility] ENDOSCOPY;  Service: Endoscopy;  Laterality: N/A;   CORONARY ANGIOPLASTY     eye stye lanced  1961       Home Medications    Prior to Admission medications   Medication Sig Start Date End Date Taking? Authorizing Provider  albuterol (VENTOLIN HFA) 108 (90 Base) MCG/ACT inhaler Inhale 2 puffs into the lungs every 6 (six) hours as needed for wheezing or shortness of breath. 11/01/20   Minna Antis, MD  aspirin EC 81 MG tablet Take 81 mg by mouth daily.    [provider]  blood glucose meter kit and supplies by Other route as directed. Dispense based on patient and insurance preference. Use up to four times daily as directed. (FOR ICD-10 E10.9, E11.9).    [provider]  isosorbide mononitrate (IMDUR) 30 MG 24 hr tablet Take 30 mg by mouth daily.    [provider]  lisinopril (PRINIVIL,ZESTRIL) 5 MG tablet Take 5 mg by mouth daily.    [provider]  metFORMIN (GLUCOPHAGE) 1000 MG tablet Take 1,000 mg by mouth 2 (two) times daily with a meal.     [provider]  metoprolol succinate (TOPROL-XL) 50 MG 24 hr tablet Take 50 mg by mouth daily. Take with or immediately following a meal.    [provider]  nitroGLYCERIN (NITROSTAT) 0.4 MG SL tablet Place 0.4 mg under the tongue every 5 (five) minutes as needed for chest pain.    [provider]  prasugrel (EFFIENT) 10 MG TABS tablet Take 10 mg by mouth daily.    [provider]  simvastatin (ZOCOR) 20 MG tablet Take 20 mg by mouth daily.    [provider]    Family History No family history on file.  Social History Social History   Tobacco Use   Smoking status: Never   Smokeless tobacco: Former    Types: Associate Professor Use: Never used  Substance Use Topics   Alcohol use: Never   Drug use: Never     Allergies   Patient has no known allergies.   Review of Systems Review of Systems   Physical Exam Triage Vital Signs ED Triage Vitals  Enc Vitals Group     BP 05/07/23 1425 133/79     Pulse Rate 05/07/23 1425 78     Resp 05/07/23 1425 16     Temp 05/07/23 1425 99.1 F (37.3 C)     Temp  Source 05/07/23 1425 Oral     SpO2 05/07/23 1425 96 %     Weight --      Height --      Head Circumference --      Peak Flow --      Pain Score 05/07/23 1426 1     Pain Loc --      Pain Edu? --      Excl. in GC? --    No data found.  Updated Vital Signs BP 133/79 (BP Location: Left Arm)   Pulse 78   Temp 99.1 F (37.3 C) (Oral)   Resp 16   SpO2 96%   Visual Acuity Right Eye Distance:   Left Eye Distance:   Bilateral Distance:    Right Eye Near:   Left Eye Near:    Bilateral Near:     Physical Exam Vitals reviewed.  Constitutional:      Appearance: Normal appearance.  HENT:     Head:      Right Ear: Tympanic membrane normal.     Left Ear: Tympanic membrane normal.     Mouth/Throat:     Dentition: Normal dentition. No dental tenderness.  Skin:    General: Skin is warm and dry.  Neurological:      General: No focal deficit present.     Mental Status: He is alert and oriented to person, place, and time.  Psychiatric:        Mood and Affect: Mood normal.        Behavior: Behavior normal.      UC Treatments / Results  Labs (all labs ordered are listed, but only abnormal results are displayed) Labs Reviewed - No data to display  EKG   Radiology No results found.  Procedures Procedures (including critical care time)  Medications Ordered in UC Medications - No data to display  Initial Impression / Assessment and Plan / UC Course  I have reviewed the triage vital signs and the nursing notes.  Pertinent labs & imaging results that were available during my care of the patient were reviewed by me and considered in my medical decision making (see chart for details).   ELIZANDRO MACISAAC is a 67 y.o. male presenting with L jaw and ear pain. Patient is afebrile without recent antipyretics, satting well on room air. Overall is well appearing though non-toxic, well hydrated, without respiratory distress. Left jaw is tender to palpation at the parotid gland.  TMs are WNL bilaterally.  There is no abnormal dentition.  No dental pain.  Reviewed relevant chart history.   Suspect parotitis, 2/2 bacterial infection.  Will treat with antibiotic therapy using Augmentin.  Also prescribing prednisone to relieve the acute inflammation causing his discomfort.  Counseled patient on potential for adverse effects with medications prescribed/recommended today, ER and return-to-clinic precautions discussed, patient verbalized understanding and agreement with care plan.  Final Clinical Impressions(s) / UC Diagnoses   Final diagnoses:  None   Discharge Instructions   None    ED Prescriptions   None    PDMP not reviewed this encounter.   Charma Igo, [place] 05/07/23 1445

## 2023-05-07 NOTE — ED Triage Notes (Signed)
Left sided jaw swelling starting two days ago with tenderness and soreness that prevents him from opening mouth fully. Last had dental work done a couple months ago, but hasn't had any issues with those since.

## 2023-05-27 DIAGNOSIS — E1140 Type 2 diabetes mellitus with diabetic neuropathy, unspecified: Secondary | ICD-10-CM | POA: Diagnosis not present

## 2023-05-27 DIAGNOSIS — H9203 Otalgia, bilateral: Secondary | ICD-10-CM | POA: Diagnosis not present

## 2023-07-03 DEATH — deceased
